# Patient Record
Sex: Male | Born: 1990 | Hispanic: No | Marital: Single | Smoking: Current every day smoker
Health system: Southern US, Community
[De-identification: ages and names within clinical notes are randomized; demographics above are authoritative.]

## PROBLEM LIST (undated history)

## (undated) DIAGNOSIS — I10 Essential (primary) hypertension: Secondary | ICD-10-CM

## (undated) DIAGNOSIS — I4891 Unspecified atrial fibrillation: Secondary | ICD-10-CM

---
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
## 2013-05-27 ENCOUNTER — Emergency Department (HOSPITAL_COMMUNITY)
Admission: EM | Admit: 2013-05-27 | Discharge: 2013-05-27 | Disposition: A | Discharge status: Home / Self Care | Payer: Self-pay | Attending: Emergency Medicine | Admitting: Emergency Medicine

## 2013-05-27 ENCOUNTER — Encounter (HOSPITAL_COMMUNITY): Payer: Self-pay | Admitting: Emergency Medicine

## 2013-05-27 ENCOUNTER — Emergency Department (HOSPITAL_COMMUNITY): Payer: Self-pay

## 2013-05-27 DIAGNOSIS — R079 Chest pain, unspecified: Secondary | ICD-10-CM | POA: Insufficient documentation

## 2013-05-27 DIAGNOSIS — F17200 Nicotine dependence, unspecified, uncomplicated: Secondary | ICD-10-CM | POA: Insufficient documentation

## 2013-05-27 LAB — BASIC METABOLIC PANEL
BUN: 9 mg/dL (ref 6–23)
CALCIUM: 10 mg/dL (ref 8.4–10.5)
CHLORIDE: 100 meq/L (ref 96–112)
CO2: 25 meq/L (ref 19–32)
Creatinine, Ser: 0.97 mg/dL (ref 0.50–1.35)
GFR calc Af Amer: >90 mL/min (ref >90)
GFR calc non Af Amer: >90 mL/min (ref >90)
Glucose, Bld: 99 mg/dL (ref 70–99)
POTASSIUM: 4.4 meq/L (ref 3.7–5.3)
SODIUM: 139 meq/L (ref 137–147)

## 2013-05-27 LAB — CBC WITH DIFFERENTIAL/PLATELET
BASOS ABS: 0 10*3/uL (ref 0.0–0.1)
Basophils Relative: 0 % (ref 0–1)
Eosinophils Absolute: 0.2 10*3/uL (ref 0.0–0.7)
Eosinophils Relative: 2 % (ref 0–5)
HCT: 43.9 % (ref 39.0–52.0)
Hemoglobin: 16.7 g/dL (ref 13.0–17.0)
LYMPHS ABS: 1.9 10*3/uL (ref 0.7–4.0)
LYMPHS PCT: 24 % (ref 12–46)
MCH: 28.1 pg (ref 26.0–34.0)
MCHC: 38 g/dL — ABNORMAL HIGH (ref 30.0–36.0)
MCV: 73.4 fL — ABNORMAL LOW (ref 78.0–100.0)
Monocytes Absolute: 1.3 10*3/uL — ABNORMAL HIGH (ref 0.1–1.0)
Monocytes Relative: 16 % — ABNORMAL HIGH (ref 3–12)
NEUTROS ABS: 4.5 10*3/uL (ref 1.7–7.7)
NEUTROS PCT: 58 % (ref 43–77)
PLATELETS: 196 10*3/uL (ref 150–400)
RBC: 5.98 MIL/uL — AB (ref 4.22–5.81)
RDW: 14.8 % (ref 11.5–15.5)
WBC: 7.9 10*3/uL (ref 4.0–10.5)

## 2013-05-27 LAB — I-STAT TROPONIN, ED: Troponin i, poc: 0 ng/mL (ref 0.00–0.08)

## 2013-05-27 MED ORDER — ONDANSETRON 4 MG PO TBDP
4.0000 mg | ORAL_TABLET | Freq: Once | ORAL | Status: AC
  Administered 2013-05-27: 4 mg via ORAL
  Filled 2013-05-27: qty 1

## 2013-05-27 NOTE — ED Provider Notes (Signed)
Medical screening examination/treatment/procedure(s) were performed by non-physician practitioner and as supervising physician I was immediately available for consultation/collaboration.   EKG Interpretation   Date/Time:  Saturday May 27 2013 09:03:33 EDT Ventricular Rate:  70 PR Interval:  112 QRS Duration: 98 QT Interval:  342 QTC Calculation: 369 R Axis:   77 Text Interpretation:  Normal sinus rhythm Normal ECG Confirmed by Malva CoganELOS   MD, Ishaan Villamar ([id number]) on 05/27/2013 10:14:12 AM       Geoffery Lyonsouglas Noam Franzen, MD 05/27/13 1501

## 2013-05-27 NOTE — ED Provider Notes (Signed)
CSN: [id number]     Arrival date & time 05/27/13  0854 History   First MD Initiated Contact with Patient 05/27/13 [phone]     Chief Complaint  Patient presents with  . Chest Pain     (Consider location/radiation/quality/duration/timing/severity/associated sxs/prior Treatment) The history is provided by the patient and medical records.   This is a 23 y.o. M with no significant PMH presenting to the ED for an episode of chest pain around 0400 this morning.  States he was out with friends last night, drinking beers and snorted a few lines of cocaine.  States he is not usually a drug user, denies any other illicit drug use.  Pt states around 0400, he was still awake and developed some left chest pain associates with paresthesias of bilateral hands.  He denies SOB, diaphoresis, palpitations, dizziness, or feelings of syncope.  States sx resolved prior to arrival in the ED.  Pt admits to family cardiac hx.  Denies family hx of sudden cardiac death.  Pt is a daily smoker.    History reviewed. No pertinent past medical history. History reviewed. No pertinent past surgical history. History reviewed. No pertinent family history. History  Substance Use Topics  . Smoking status: Current Every Day Smoker    Types: Cigarettes  . Smokeless tobacco: Not on file  . Alcohol Use: Yes    Review of Systems  Cardiovascular: Positive for chest pain.  All other systems reviewed and are negative.      Allergies  Review of patient's allergies indicates no known allergies.  Home Medications  No current outpatient prescriptions on file. BP 147/91  Pulse 109  Temp(Src) 97.5 F (36.4 C) (Oral)  Resp 20  Ht 5\' 9"  (1.753 m)  Wt 195 lb (88.451 kg)  BMI 28.78 kg/m2  SpO2 93%  Physical Exam  Nursing note and vitals reviewed. Constitutional: He is oriented to person, place, and time. He appears well-developed and well-nourished. No distress.  HENT:  Head: Normocephalic and atraumatic.  Mouth/Throat:  Oropharynx is clear and moist.  Eyes: Conjunctivae and EOM are normal. Pupils are equal, round, and reactive to light.  Neck: Normal range of motion. Neck supple.  Cardiovascular: Normal rate, regular rhythm and normal heart sounds.   Pulmonary/Chest: Effort normal and breath sounds normal. No respiratory distress. He has no wheezes.  Abdominal: Soft. Bowel sounds are normal. There is no tenderness. There is no guarding.  Musculoskeletal: Normal range of motion. He exhibits no edema.  Neurological: He is alert and oriented to person, place, and time.  Skin: Skin is warm and dry. He is not diaphoretic.  Psychiatric: He has a normal mood and affect.    ED Course  Procedures (including critical care time) Labs Review Labs Reviewed  CBC WITH DIFFERENTIAL - Abnormal; Notable for the following:    RBC 5.98 (*)    MCV 73.4 (*)    MCHC 38.0 (*)    Monocytes Relative 16 (*)    Monocytes Absolute 1.3 (*)    All other components within normal limits  BASIC METABOLIC PANEL  I-STAT TROPOININ, ED  I-STAT TROPOININ, ED   Imaging Review No results found.   EKG Interpretation   Date/Time:  Saturday May 27 2013 09:03:33 EDT Ventricular Rate:  70 PR Interval:  112 QRS Duration: 98 QT Interval:  342 QTC Calculation: 369 R Axis:   77 Text Interpretation:  Normal sinus rhythm Normal ECG Confirmed by DELOS   MD, DOUGLAS ([id number]) on 05/27/2013 10:14:12 AM  MDM   Final diagnoses:  Chest pain   KEG NSR, no acute ischemic changes.  Labs reassuring.  Trop negative.  CXR results not transferring to EPIC-- reviewed in PACS, negative for cardiopulmonary disease read by Dr. Grace IsaacWatts.  Patient has remained asymptomatic while in the emergency department. His episode of chest pain possibly related to his cocaine use, which he acknowledges was a "very bad idea".  At this time I have low suspicion for ACS, PE, dissection, or other acute cardiac event.  Patient will follow with his primary care  physician if problems occur. Return cautions about 3 or worsening symptoms.  Garlon HatchetLisa M Gaylia Kassel, PA-C 05/27/13 1452

## 2013-05-27 NOTE — ED Notes (Signed)
Patient transported to X-ray 

## 2013-05-27 NOTE — ED Notes (Signed)
Pt presents to department for evaluation of chest pressure, states he began having chest pressure, L arm numbness and neck pain at work yesterday. Denies pain upon arrival. Respirations unlabored. Pt is conscious alert and oriented x4. Skin warm and dry.

## 2015-06-05 IMAGING — CR DG CHEST 2V
2 series · 2 of 2 positions shown · non-contrast
Comparison: 10/25/2006

CLINICAL DATA: Leg arm and numbness.  Chest pain.

EXAM:
CHEST  2 VIEW

[w chest pa]
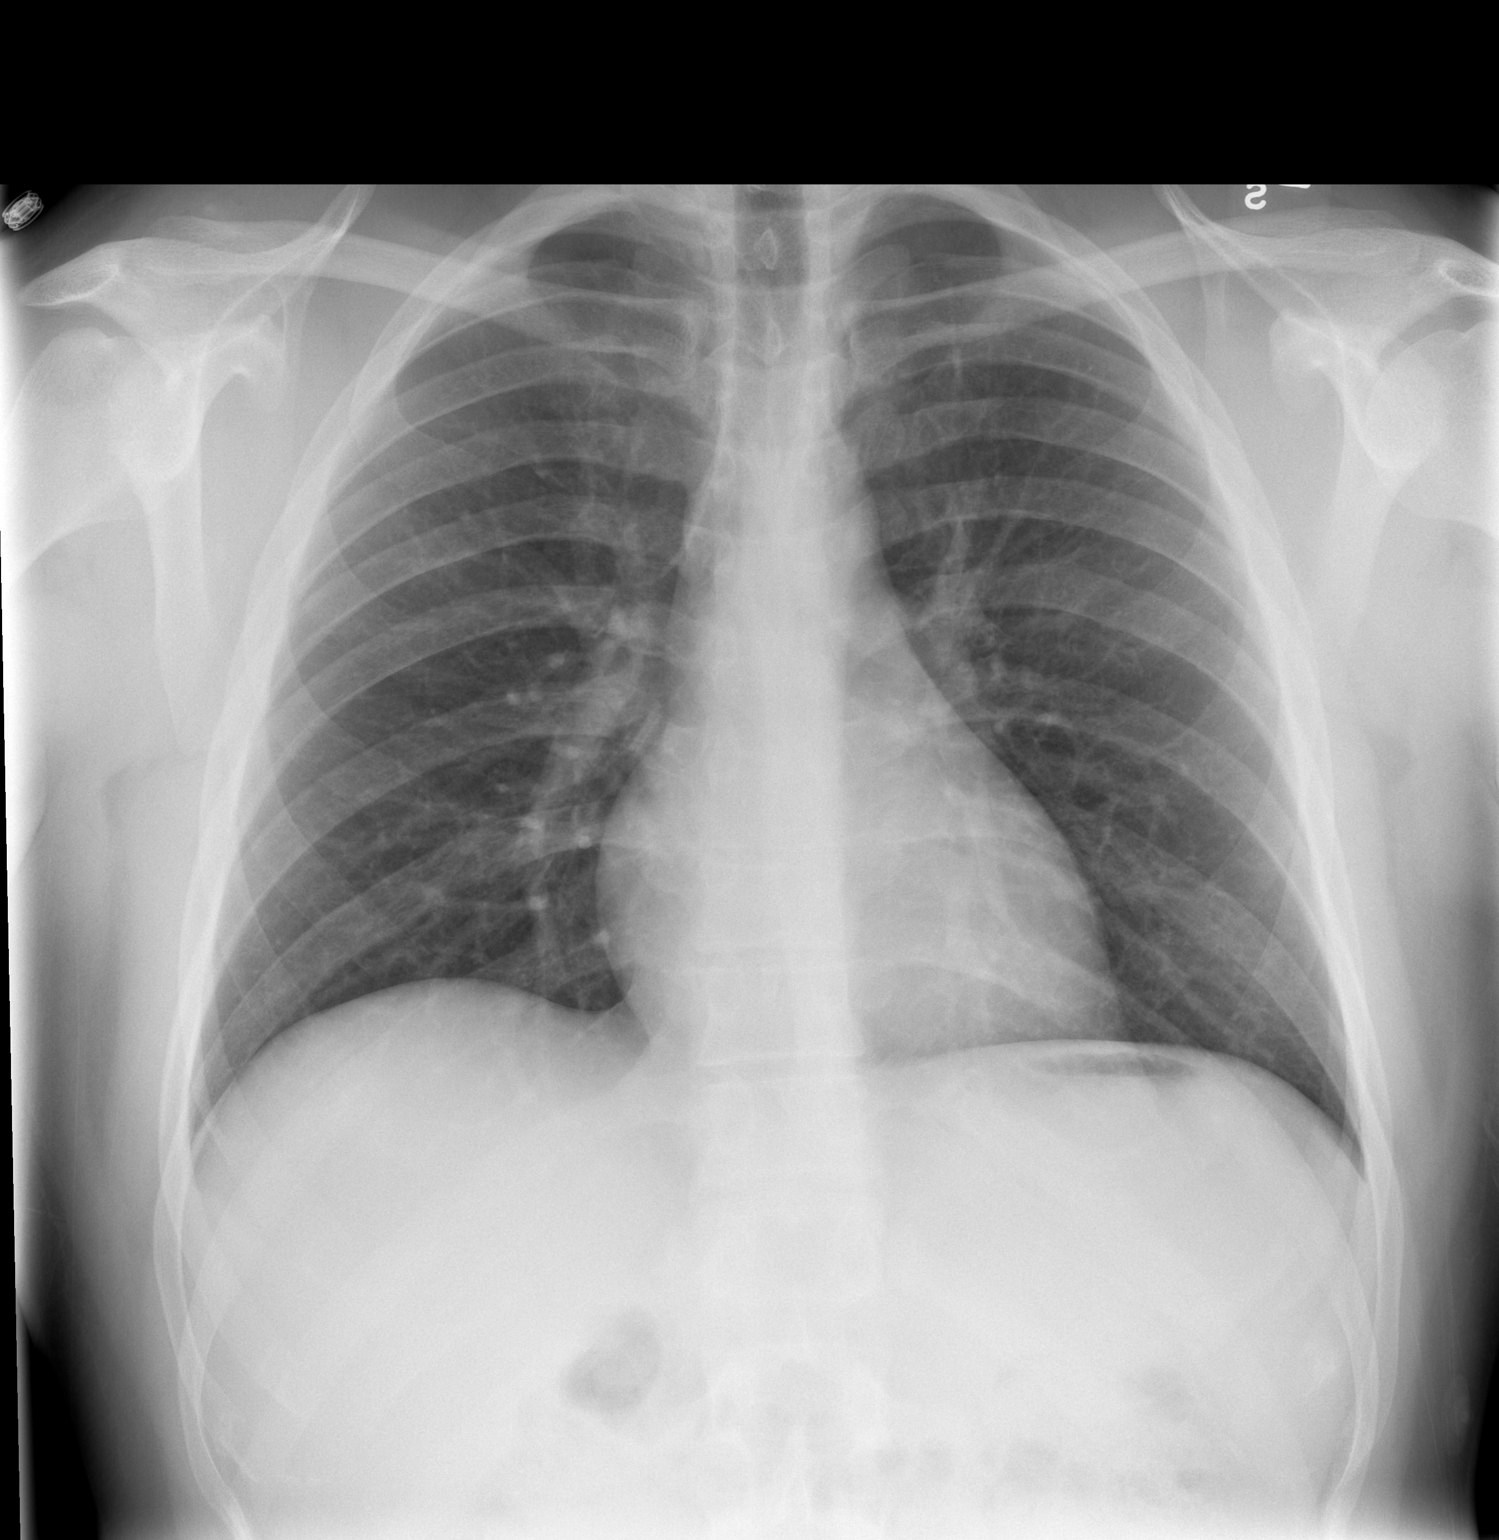

[w chest lat]
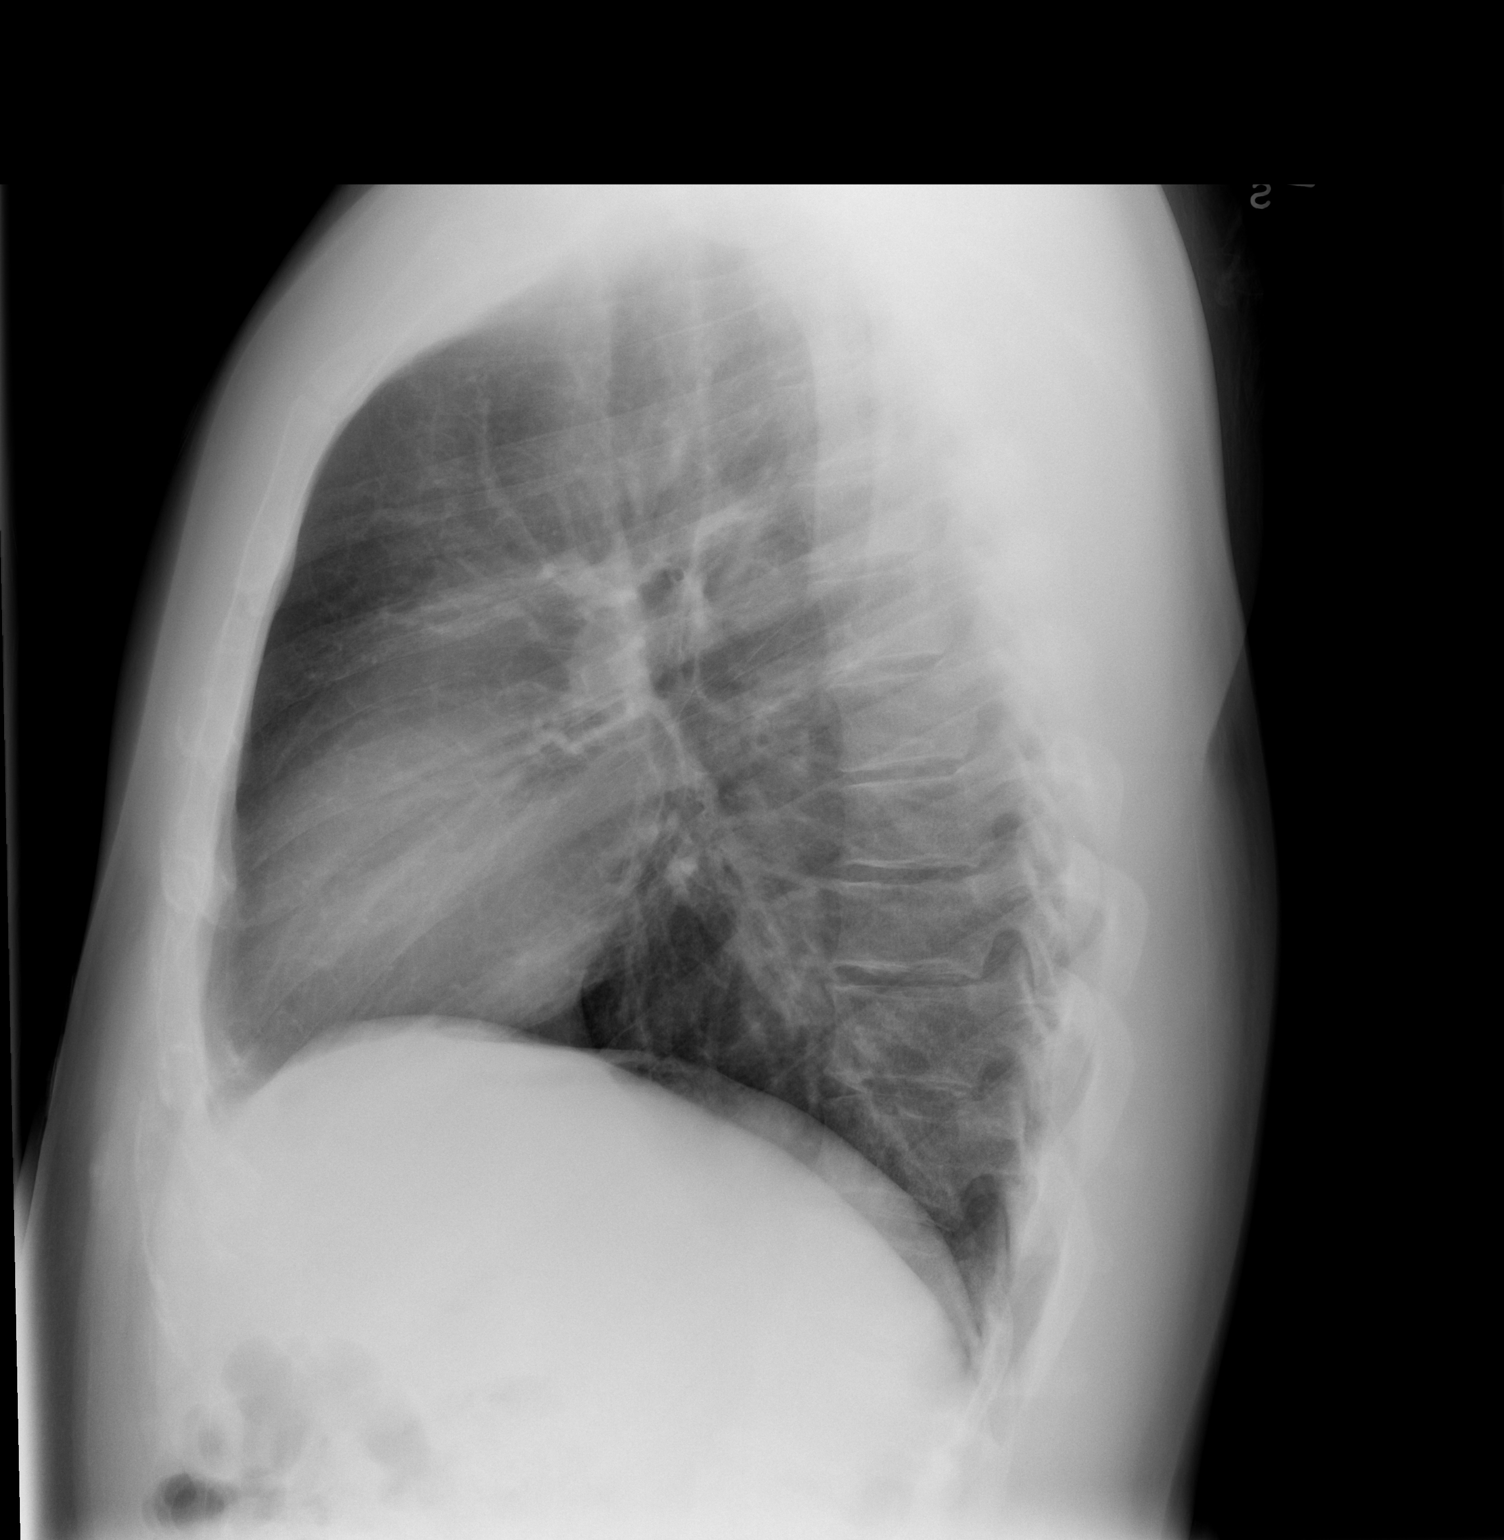

[2 of 2 positions shown; findings below may reference images not displayed]

FINDINGS: Normal heart size and mediastinal contours. No acute infiltrate or
edema. No effusion or pneumothorax. No acute osseous findings.
IMPRESSION: No active cardiopulmonary disease.

## 2019-11-10 ENCOUNTER — Emergency Department (HOSPITAL_COMMUNITY)
Admission: EM | Admit: 2019-11-10 | Discharge: 2019-11-10 | Disposition: A | Discharge status: Home / Self Care | Payer: Self-pay | Attending: Emergency Medicine | Admitting: Emergency Medicine

## 2019-11-10 ENCOUNTER — Other Ambulatory Visit: Payer: Self-pay

## 2019-11-10 DIAGNOSIS — F419 Anxiety disorder, unspecified: Secondary | ICD-10-CM | POA: Insufficient documentation

## 2019-11-10 DIAGNOSIS — Z7982 Long term (current) use of aspirin: Secondary | ICD-10-CM | POA: Insufficient documentation

## 2019-11-10 DIAGNOSIS — F1299 Cannabis use, unspecified with unspecified cannabis-induced disorder: Secondary | ICD-10-CM | POA: Insufficient documentation

## 2019-11-10 DIAGNOSIS — R5383 Other fatigue: Secondary | ICD-10-CM | POA: Insufficient documentation

## 2019-11-10 DIAGNOSIS — R000 Tachycardia, unspecified: Secondary | ICD-10-CM | POA: Insufficient documentation

## 2019-11-10 DIAGNOSIS — F17200 Nicotine dependence, unspecified, uncomplicated: Secondary | ICD-10-CM | POA: Insufficient documentation

## 2019-11-10 DIAGNOSIS — F1290 Cannabis use, unspecified, uncomplicated: Secondary | ICD-10-CM

## 2019-11-10 DIAGNOSIS — R5381 Other malaise: Secondary | ICD-10-CM | POA: Insufficient documentation

## 2019-11-10 LAB — BASIC METABOLIC PANEL
Anion gap: 13 (ref 5–15)
BUN: 13 mg/dL (ref 6–20)
CO2: 24 mmol/L (ref 22–32)
Calcium: 9.6 mg/dL (ref 8.9–10.3)
Chloride: 99 mmol/L (ref 98–111)
Creatinine, Ser: 1.03 mg/dL (ref 0.61–1.24)
GFR calc Af Amer: >60 mL/min (ref >60)
GFR calc non Af Amer: >60 mL/min (ref >60)
Glucose, Bld: 127 mg/dL — ABNORMAL HIGH (ref 70–99)
Potassium: 3.8 mmol/L (ref 3.5–5.1)
Sodium: 136 mmol/L (ref 135–145)

## 2019-11-10 LAB — CBC
HCT: 41.8 % (ref 39.0–52.0)
Hemoglobin: 14.3 g/dL (ref 13.0–17.0)
MCH: 24.7 pg — ABNORMAL LOW (ref 26.0–34.0)
MCHC: 34.2 g/dL (ref 30.0–36.0)
MCV: 72.2 fL — ABNORMAL LOW (ref 80.0–100.0)
Platelets: 226 10*3/uL (ref 150–400)
RBC: 5.79 MIL/uL (ref 4.22–5.81)
RDW: 15 % (ref 11.5–15.5)
WBC: 8 10*3/uL (ref 4.0–10.5)
nRBC: 0 % (ref 0.0–0.2)

## 2019-11-10 LAB — MAGNESIUM: Magnesium: 1.7 mg/dL (ref 1.7–2.4)

## 2019-11-10 MED ORDER — LORAZEPAM 2 MG/ML IJ SOLN
2.0000 mg | Freq: Once | INTRAMUSCULAR | Status: AC
  Administered 2019-11-10: 2 mg via INTRAVENOUS

## 2019-11-10 MED ORDER — SODIUM CHLORIDE 0.9 % IV BOLUS
1000.0000 mL | Freq: Once | INTRAVENOUS | Status: AC
  Administered 2019-11-10: 1000 mL via INTRAVENOUS

## 2019-11-10 MED ORDER — LORAZEPAM 2 MG/ML IJ SOLN
INTRAMUSCULAR | Status: AC
  Filled 2019-11-10: qty 1

## 2019-11-10 MED ORDER — MAGNESIUM OXIDE 400 (241.3 MG) MG PO TABS
800.0000 mg | ORAL_TABLET | Freq: Once | ORAL | Status: AC
  Administered 2019-11-10: 800 mg via ORAL
  Filled 2019-11-10: qty 2

## 2019-11-10 MED ORDER — POTASSIUM CHLORIDE CRYS ER 20 MEQ PO TBCR
40.0000 meq | EXTENDED_RELEASE_TABLET | Freq: Once | ORAL | Status: AC
  Administered 2019-11-10: 40 meq via ORAL
  Filled 2019-11-10: qty 2

## 2019-11-10 NOTE — ED Provider Notes (Signed)
This patient is a 29 year old male, he has a history of supraventricular tachycardia type rhythms in the past for which she has successfully had chemical cardioversion, takes daily Cardizem, tried CBD Gummies and states that in the past when he smoked marijuana he had similar tachycardic-like episodes.  He last used cocaine a week ago.  After taking 2 Gummies today while he was at the store about 20 minutes later he developed palpitations.  On exam he has a heart rate of 120, it is sinus tachycardia, he is otherwise nontoxic, has no edema, is well-appearing, no vomiting, he is normotensive and afebrile.  Fluids, Ativan, rate control as needed, anticipate discharge, well-appearing.  He is not in a toxic arrhythmia  I saw and evaluated the patient, reviewed the resident's note and I agree with the findings and plan.   EKG Interpretation  Date/Time:  Friday November 10 2019 18:17:32 EDT Ventricular Rate:  142 PR Interval:  130 QRS Duration: 84 QT Interval:  280 QTC Calculation: 430 R Axis:   80 Text Interpretation: Sinus tachycardia Cannot rule out Anterior infarct , age undetermined T wave abnormality, consider inferior ischemia Abnormal ECG Confirmed by Eber Hong ([id number]) on 11/10/2019 6:47:13 PM        I personally interpreted the EKG as well as the resident and agree with the interpretation on the resident's chart.  Final diagnoses:  Tachycardia  Use of cannabinoid edibles  Anxiety      Eber Hong, MD 11/13/19 1131

## 2019-11-10 NOTE — ED Triage Notes (Signed)
Pt with hx of afib rvr to ED after eating some cbd gummies.  HR in 130's.

## 2019-11-10 NOTE — ED Provider Notes (Signed)
[facility] EMERGENCY DEPARTMENT Provider Note   CSN: [id number] Arrival date & time: 11/10/19  1813     History Chief Complaint  Patient presents with  . Tachycardia    Brian Vance is a 29 y.o. male with history of atrial fibrillation, polysubstance abuse presents to the ED today with complaint of anxiety, malaise, palpitations.  Patient states that this started immediately after consuming some CBD Gummies 2 hours prior to presentation.  He states the symptoms are similar to prior episodes of atrial fibrillation.  He takes diltiazem for this and states he has not missed any doses.  He does endorse some cocaine use as recently as a week ago.  The history is provided by the patient.  Illness Quality:  Palpitations Severity:  Severe Onset quality:  Sudden Duration:  2 hours Timing:  Constant Progression:  Improving Chronicity:  New Context:  CBD edibles Associated symptoms: fatigue   Associated symptoms: no abdominal pain, no chest pain, no cough, no fever, no headaches, no rash, no shortness of breath and no vomiting        No past medical history on file.  There are no problems to display for this patient.   No past surgical history on file.     No family history on file.  Social History   Tobacco Use  . Smoking status: Current Every Day Smoker    Types: Cigarettes  Substance Use Topics  . Alcohol use: Yes  . Drug use: No    Home Medications Prior to Admission medications   Medication Sig Start Date End Date Taking? Authorizing Provider  aspirin 81 MG tablet Take 81 mg by mouth daily.    [provider]    Allergies    Patient has no known allergies.  Review of Systems   Review of Systems  Constitutional: Positive for fatigue. Negative for chills and fever.  HENT: Negative for facial swelling and voice change.   Eyes: Negative for redness and visual disturbance.  Respiratory: Negative for cough and shortness of breath.     Cardiovascular: Positive for palpitations. Negative for chest pain.  Gastrointestinal: Negative for abdominal pain and vomiting.  Genitourinary: Negative for difficulty urinating and dysuria.  Musculoskeletal: Negative for gait problem and joint swelling.  Skin: Negative for rash and wound.  Neurological: Negative for dizziness and headaches.  Psychiatric/Behavioral: Negative for confusion and suicidal ideas.    Physical Exam Updated Vital Signs BP 120/76   Pulse 100   Temp 98.9 F (37.2 C) (Axillary)   Resp 18   SpO2 97%   Physical Exam Constitutional:      Appearance: He is not toxic-appearing.     Comments: Uncomfortable appearing  HENT:     Head: Normocephalic and atraumatic.     Mouth/Throat:     Mouth: Mucous membranes are moist.     Pharynx: Oropharynx is clear.  Eyes:     General: No scleral icterus.    Pupils: Pupils are equal, round, and reactive to light.     Comments: Conjunctival injection bilaterally  Cardiovascular:     Rate and Rhythm: Regular rhythm. Tachycardia present.     Pulses: Normal pulses.  Pulmonary:     Effort: Pulmonary effort is normal. No respiratory distress.  Abdominal:     General: There is no distension.     Tenderness: There is no abdominal tenderness.  Musculoskeletal:        General: No tenderness or deformity.     Cervical back:  Normal range of motion and neck supple.  Neurological:     General: No focal deficit present.     Mental Status: He is alert and oriented to person, place, and time.  Psychiatric:        Mood and Affect: Mood normal.        Behavior: Behavior normal.     ED Results / Procedures / Treatments   Labs (all labs ordered are listed, but only abnormal results are displayed) Labs Reviewed  BASIC METABOLIC PANEL - Abnormal; Notable for the following components:      Result Value   Glucose, Bld 127 (*)    All other components within normal limits  CBC - Abnormal; Notable for the following components:    MCV 72.2 (*)    MCH 24.7 (*)    All other components within normal limits  MAGNESIUM    EKG EKG Interpretation  Date/Time:  Friday November 10 2019 18:17:32 EDT Ventricular Rate:  142 PR Interval:  130 QRS Duration: 84 QT Interval:  280 QTC Calculation: 430 R Axis:   80 Text Interpretation: Sinus tachycardia Cannot rule out Anterior infarct , age undetermined T wave abnormality, consider inferior ischemia Abnormal ECG Confirmed by Eber Hong ([id number]) on 11/10/2019 6:47:13 PM   Radiology No results found.  Procedures Procedures (including critical care time)  Medications Ordered in ED Medications  sodium chloride 0.9 % bolus 1,000 mL (0 mLs Intravenous Stopped 11/10/19 2022)  LORazepam (ATIVAN) injection 2 mg (2 mg Intravenous Given 11/10/19 1900)  sodium chloride 0.9 % bolus 1,000 mL (1,000 mLs Intravenous New Bag/Given 11/10/19 1941)  magnesium oxide (MAG-OX) tablet 800 mg (800 mg Oral Given 11/10/19 2021)  potassium chloride SA (KLOR-CON) CR tablet 40 mEq (40 mEq Oral Given 11/10/19 2021)    ED Course  I have reviewed the triage vital signs and the nursing notes.  Pertinent labs & imaging results that were available during my care of the patient were reviewed by me and considered in my medical decision making (see chart for details).  Clinical Course as of Nov 09 2025  Fri Nov 10, 2019  1955 On reassessment, heart rate improved to 105 patient somewhat sleepy after Ativan although symptomatically much improved.   [JR]    Clinical Course User Index [JR] Loree Fee, MD   MDM Rules/Calculators/A&P                          Differential diagnosis considered: Atrial fibrillation with RVR, 1 1 flutter, SVT, sepsis, intoxication, withdrawal, dehydration  Patient presents with palpitations and general malaise in the setting of heart rate in the 140s that started nearly immediately after consumption of CBD edibles.  Patient does endorse a history of similar response to  smoked marijuana in the past though denies history of edibles.  Endorses most recent cocaine use about a week ago.  EKG findings by my read: Compared to prior: 05/28/2013.  Rate: 142 rhythm: sinus Axis: Left shift PR: 130 QRS: 84 QTc: 430.  Significant sinus tachycardia, could represent one-to-one flutter though with variable heart rate through time, find is unlikely, no evidence of ischemia or other concerning findings for arrhythmia.  Suspect patient's sinus tachycardia is likely due to the drug use, patient denies recent sympathomimetics though chemical make-up of ingested edibles is unclear his heart rate is variable through time, doubt SVT or one-to-one flutter, we will minister fluids and benzodiazepines at this time with suspected toxidrome and reassess.  Basic labs including magnesium obtained for arrhythmia work-up in parallel.  Potassium 3 however to optimize him for arrhythmia, will replete p.o.  Reassessment after Ativan and 1 L fluids, patient with heart rate improved to 105 patient significantly symptomatically improved  Patient reassessed with sleepy but arousable and reports complete symptomatic improvement.  At this time I feel that he can stably discharged home.  He states that he has a friend who can give him a ride home.  Counseled to avoid cannabinoid use in the future and to touch base with his cardiologist if symptoms recur or persist in any way.  Final Clinical Impression(s) / ED Diagnoses Final diagnoses:  Tachycardia  Use of cannabinoid edibles  Anxiety    Rx / DC Orders ED Discharge Orders    None     Labs, studies and imaging reviewed by myself and considered in medical decision making if ordered. Imaging interpreted by radiology. Pt was discussed with my attending, Dr. Hyacinth Meeker  Electronically signed by:  Christiane Ha Redding8/27/20218:27 PM       Loree Fee, MD 11/10/19 [id number]    Eber Hong, MD 11/13/19 1131

## 2021-09-16 ENCOUNTER — Encounter: Payer: Self-pay | Admitting: Emergency Medicine

## 2021-09-16 ENCOUNTER — Emergency Department
Admission: EM | Admit: 2021-09-16 | Discharge: 2021-09-16 | Disposition: A | Discharge status: Home / Self Care | Payer: Self-pay | Attending: Student in an Organized Health Care Education/Training Program | Admitting: Student in an Organized Health Care Education/Training Program

## 2021-09-16 ENCOUNTER — Other Ambulatory Visit: Payer: Self-pay

## 2021-09-16 DIAGNOSIS — Z5321 Procedure and treatment not carried out due to patient leaving prior to being seen by health care provider: Secondary | ICD-10-CM | POA: Insufficient documentation

## 2021-09-16 DIAGNOSIS — R42 Dizziness and giddiness: Secondary | ICD-10-CM | POA: Insufficient documentation

## 2021-09-16 DIAGNOSIS — E860 Dehydration: Secondary | ICD-10-CM | POA: Insufficient documentation

## 2021-09-16 HISTORY — DX: Essential (primary) hypertension: I10

## 2021-09-16 HISTORY — DX: Unspecified atrial fibrillation: I48.91

## 2021-09-16 NOTE — ED Triage Notes (Signed)
Pt via POV from home. Pt c/o dehydration, dizziness, and lightheadedness that started today after work outside all day. States he started feeling this way just today. Pt is A&OX4 and NAD

## 2021-09-16 NOTE — ED Notes (Signed)
Pt called x1

## 2021-09-16 NOTE — ED Notes (Signed)
Attempted to call patient on the phone at this time. Pt did not answer and voicemail full. Pt told this RN that he was going to get his phone charger out of his car but did not return.

## 2021-09-16 NOTE — ED Notes (Signed)
Pt called x 2
